# Patient Record
Sex: Female | Born: 1975 | Race: Black or African American | Hispanic: No | Marital: Single | Smoking: Never smoker
Health system: Southern US, Community
[De-identification: ages and names within clinical notes are randomized; demographics above are authoritative.]

## PROBLEM LIST (undated history)

## (undated) HISTORY — PX: OTHER SURGICAL HISTORY: SHX169

---

## 1999-04-02 ENCOUNTER — Emergency Department (HOSPITAL_COMMUNITY): Admission: EM | Admit: 1999-04-02 | Discharge: 1999-04-02 | Payer: Self-pay | Admitting: Emergency Medicine

## 2002-09-09 ENCOUNTER — Other Ambulatory Visit: Admission: RE | Admit: 2002-09-09 | Discharge: 2002-09-09 | Payer: Self-pay | Admitting: Obstetrics and Gynecology

## 2004-03-25 ENCOUNTER — Other Ambulatory Visit: Admission: RE | Admit: 2004-03-25 | Discharge: 2004-03-25 | Payer: Self-pay | Admitting: Obstetrics and Gynecology

## 2007-04-10 ENCOUNTER — Emergency Department (HOSPITAL_COMMUNITY): Admission: EM | Admit: 2007-04-10 | Discharge: 2007-04-10 | Payer: Self-pay | Admitting: Emergency Medicine

## 2008-05-14 ENCOUNTER — Emergency Department (HOSPITAL_COMMUNITY): Admission: EM | Admit: 2008-05-14 | Discharge: 2008-05-14 | Payer: Self-pay | Admitting: Emergency Medicine

## 2008-12-05 ENCOUNTER — Emergency Department (HOSPITAL_COMMUNITY): Admission: EM | Admit: 2008-12-05 | Discharge: 2008-12-05 | Payer: Self-pay | Admitting: Emergency Medicine

## 2008-12-08 ENCOUNTER — Emergency Department (HOSPITAL_COMMUNITY): Admission: EM | Admit: 2008-12-08 | Discharge: 2008-12-08 | Payer: Self-pay | Admitting: Emergency Medicine

## 2009-08-07 ENCOUNTER — Emergency Department (HOSPITAL_COMMUNITY): Admission: EM | Admit: 2009-08-07 | Discharge: 2009-08-07 | Payer: Self-pay | Admitting: Emergency Medicine

## 2010-05-28 ENCOUNTER — Emergency Department (HOSPITAL_COMMUNITY)
Admission: EM | Admit: 2010-05-28 | Discharge: 2010-05-28 | Disposition: A | Discharge status: Home / Self Care | Payer: Self-pay | Attending: Emergency Medicine | Admitting: Emergency Medicine

## 2010-05-28 DIAGNOSIS — M25569 Pain in unspecified knee: Secondary | ICD-10-CM | POA: Insufficient documentation

## 2010-05-28 DIAGNOSIS — M545 Low back pain, unspecified: Secondary | ICD-10-CM | POA: Insufficient documentation

## 2010-05-28 DIAGNOSIS — S8000XA Contusion of unspecified knee, initial encounter: Secondary | ICD-10-CM | POA: Insufficient documentation

## 2010-05-28 DIAGNOSIS — B002 Herpesviral gingivostomatitis and pharyngotonsillitis: Secondary | ICD-10-CM | POA: Insufficient documentation

## 2010-07-09 LAB — URINALYSIS, ROUTINE W REFLEX MICROSCOPIC
Bilirubin Urine: NEGATIVE
Glucose, UA: NEGATIVE mg/dL
Nitrite: NEGATIVE
Specific Gravity, Urine: 1.017 (ref 1.005–1.030)
Urobilinogen, UA: 0.2 mg/dL (ref 0.0–1.0)
pH: 6 (ref 5.0–8.0)

## 2010-07-09 LAB — WET PREP, GENITAL: Trich, Wet Prep: NONE SEEN

## 2010-07-09 LAB — URINE MICROSCOPIC-ADD ON

## 2010-07-09 LAB — GC/CHLAMYDIA PROBE AMP, GENITAL
Chlamydia, DNA Probe: NEGATIVE
GC Probe Amp, Genital: NEGATIVE

## 2010-07-27 LAB — POCT CARDIAC MARKERS
Myoglobin, poc: 57.6 ng/mL (ref 12–200)
Troponin i, poc: <0.05 ng/mL (ref 0.00–0.09)

## 2010-07-27 LAB — DIFFERENTIAL
Basophils Absolute: 0 10*3/uL (ref 0.0–0.1)
Basophils Relative: 0 % (ref 0–1)
Lymphs Abs: 1.5 10*3/uL (ref 0.7–4.0)
Monocytes Relative: 9 % (ref 3–12)

## 2010-07-27 LAB — URINALYSIS, ROUTINE W REFLEX MICROSCOPIC
Glucose, UA: NEGATIVE mg/dL
Ketones, ur: NEGATIVE mg/dL
Nitrite: NEGATIVE
Urobilinogen, UA: 0.2 mg/dL (ref 0.0–1.0)
pH: 5.5 (ref 5.0–8.0)

## 2010-07-27 LAB — URINE MICROSCOPIC-ADD ON

## 2010-07-27 LAB — BASIC METABOLIC PANEL
Calcium: 9.2 mg/dL (ref 8.4–10.5)
GFR calc Af Amer: >60 mL/min (ref >60)
Potassium: 3.4 mEq/L — ABNORMAL LOW (ref 3.5–5.1)
Sodium: 138 mEq/L (ref 135–145)

## 2010-07-27 LAB — CBC
HCT: 39.1 % (ref 36.0–46.0)
Hemoglobin: 13.6 g/dL (ref 12.0–15.0)
MCV: 101.4 fL — ABNORMAL HIGH (ref 78.0–100.0)
RDW: 12.8 % (ref 11.5–15.5)

## 2010-08-05 LAB — BASIC METABOLIC PANEL
Calcium: 8.8 mg/dL (ref 8.4–10.5)
Creatinine, Ser: 0.49 mg/dL (ref 0.4–1.2)
GFR calc non Af Amer: >60 mL/min (ref >60)
Glucose, Bld: 88 mg/dL (ref 70–99)

## 2010-08-05 LAB — URINALYSIS, ROUTINE W REFLEX MICROSCOPIC
Ketones, ur: 15 mg/dL — AB
Protein, ur: NEGATIVE mg/dL
Urobilinogen, UA: 1 mg/dL (ref 0.0–1.0)
pH: 7 (ref 5.0–8.0)

## 2010-08-05 LAB — WET PREP, GENITAL: Trich, Wet Prep: NONE SEEN

## 2010-08-05 LAB — DIFFERENTIAL
Eosinophils Absolute: 0.2 10*3/uL (ref 0.0–0.7)
Neutro Abs: 12.9 10*3/uL — ABNORMAL HIGH (ref 1.7–7.7)
Neutrophils Relative %: 77 % (ref 43–77)

## 2010-08-05 LAB — CBC
HCT: 28.5 % — ABNORMAL LOW (ref 36.0–46.0)
Platelets: 219 10*3/uL (ref 150–400)
RBC: 2.92 MIL/uL — ABNORMAL LOW (ref 3.87–5.11)

## 2010-08-24 ENCOUNTER — Emergency Department (HOSPITAL_COMMUNITY)
Admission: EM | Admit: 2010-08-24 | Discharge: 2010-08-25 | Disposition: A | Discharge status: Home / Self Care | Payer: Self-pay | Attending: Emergency Medicine | Admitting: Emergency Medicine

## 2010-08-24 DIAGNOSIS — R112 Nausea with vomiting, unspecified: Secondary | ICD-10-CM | POA: Insufficient documentation

## 2010-08-24 DIAGNOSIS — F329 Major depressive disorder, single episode, unspecified: Secondary | ICD-10-CM | POA: Insufficient documentation

## 2010-08-24 DIAGNOSIS — R5383 Other fatigue: Secondary | ICD-10-CM | POA: Insufficient documentation

## 2010-08-24 DIAGNOSIS — F3289 Other specified depressive episodes: Secondary | ICD-10-CM | POA: Insufficient documentation

## 2010-08-24 DIAGNOSIS — F1410 Cocaine abuse, uncomplicated: Secondary | ICD-10-CM | POA: Insufficient documentation

## 2010-08-24 DIAGNOSIS — F1210 Cannabis abuse, uncomplicated: Secondary | ICD-10-CM | POA: Insufficient documentation

## 2010-08-24 DIAGNOSIS — R61 Generalized hyperhidrosis: Secondary | ICD-10-CM | POA: Insufficient documentation

## 2010-08-24 DIAGNOSIS — R5381 Other malaise: Secondary | ICD-10-CM | POA: Insufficient documentation

## 2010-08-24 LAB — POCT PREGNANCY, URINE: Preg Test, Ur: NEGATIVE

## 2010-08-24 LAB — DIFFERENTIAL
Basophils Relative: 0 % (ref 0–1)
Eosinophils Absolute: 0.1 10*3/uL (ref 0.0–0.7)
Lymphocytes Relative: 30 % (ref 12–46)
Lymphs Abs: 2.4 10*3/uL (ref 0.7–4.0)
Monocytes Relative: 9 % (ref 3–12)

## 2010-08-24 LAB — COMPREHENSIVE METABOLIC PANEL
AST: 14 U/L (ref 0–37)
Albumin: 3.4 g/dL — ABNORMAL LOW (ref 3.5–5.2)
Calcium: 8.9 mg/dL (ref 8.4–10.5)
Creatinine, Ser: 0.87 mg/dL (ref 0.4–1.2)
GFR calc Af Amer: >60 mL/min (ref >60)
Total Protein: 6.6 g/dL (ref 6.0–8.3)

## 2010-08-24 LAB — CBC
HCT: 38.1 % (ref 36.0–46.0)
Hemoglobin: 13.5 g/dL (ref 12.0–15.0)
MCHC: 35.4 g/dL (ref 30.0–36.0)
MCV: 95.3 fL (ref 78.0–100.0)
RBC: 4 MIL/uL (ref 3.87–5.11)
RDW: 13.1 % (ref 11.5–15.5)
WBC: 8.1 10*3/uL (ref 4.0–10.5)

## 2010-08-25 LAB — RAPID URINE DRUG SCREEN, HOSP PERFORMED
Amphetamines: NOT DETECTED
Barbiturates: NOT DETECTED
Benzodiazepines: NOT DETECTED
Opiates: NOT DETECTED

## 2011-01-24 LAB — DIFFERENTIAL
Basophils Absolute: 0
Basophils Relative: 0
Eosinophils Absolute: 0.1 — ABNORMAL LOW
Eosinophils Relative: 1
Lymphocytes Relative: 17
Monocytes Absolute: 0.8

## 2011-01-24 LAB — CBC
HCT: 41.7
Hemoglobin: 14.8
MCHC: 35.4
MCV: 98.2
Platelets: 232
RDW: 13

## 2011-01-24 LAB — BASIC METABOLIC PANEL
BUN: 4 — ABNORMAL LOW
CO2: 25
GFR calc non Af Amer: >60
Glucose, Bld: 90
Potassium: 3.9

## 2011-01-24 LAB — RAPID URINE DRUG SCREEN, HOSP PERFORMED
Amphetamines: NOT DETECTED
Cocaine: POSITIVE — AB
Tetrahydrocannabinol: POSITIVE — AB

## 2011-01-24 LAB — URINALYSIS, ROUTINE W REFLEX MICROSCOPIC
Bilirubin Urine: NEGATIVE
Nitrite: NEGATIVE
Protein, ur: NEGATIVE
Specific Gravity, Urine: 1.004 — ABNORMAL LOW
Urobilinogen, UA: 1

## 2011-01-24 LAB — PREGNANCY, URINE: Preg Test, Ur: NEGATIVE

## 2011-01-24 LAB — URINE MICROSCOPIC-ADD ON

## 2011-12-06 IMAGING — CR DG SACRUM/COCCYX 2+V
3 series · 3 of 3 positions shown · non-contrast
Comparison: None

CLINICAL DATA: Generalized abdominal pain; fell 08/06/2009 onto
coccyx.

SACRUM AND COCCYX - 2+ VIEW

[t sacrum a.p.]
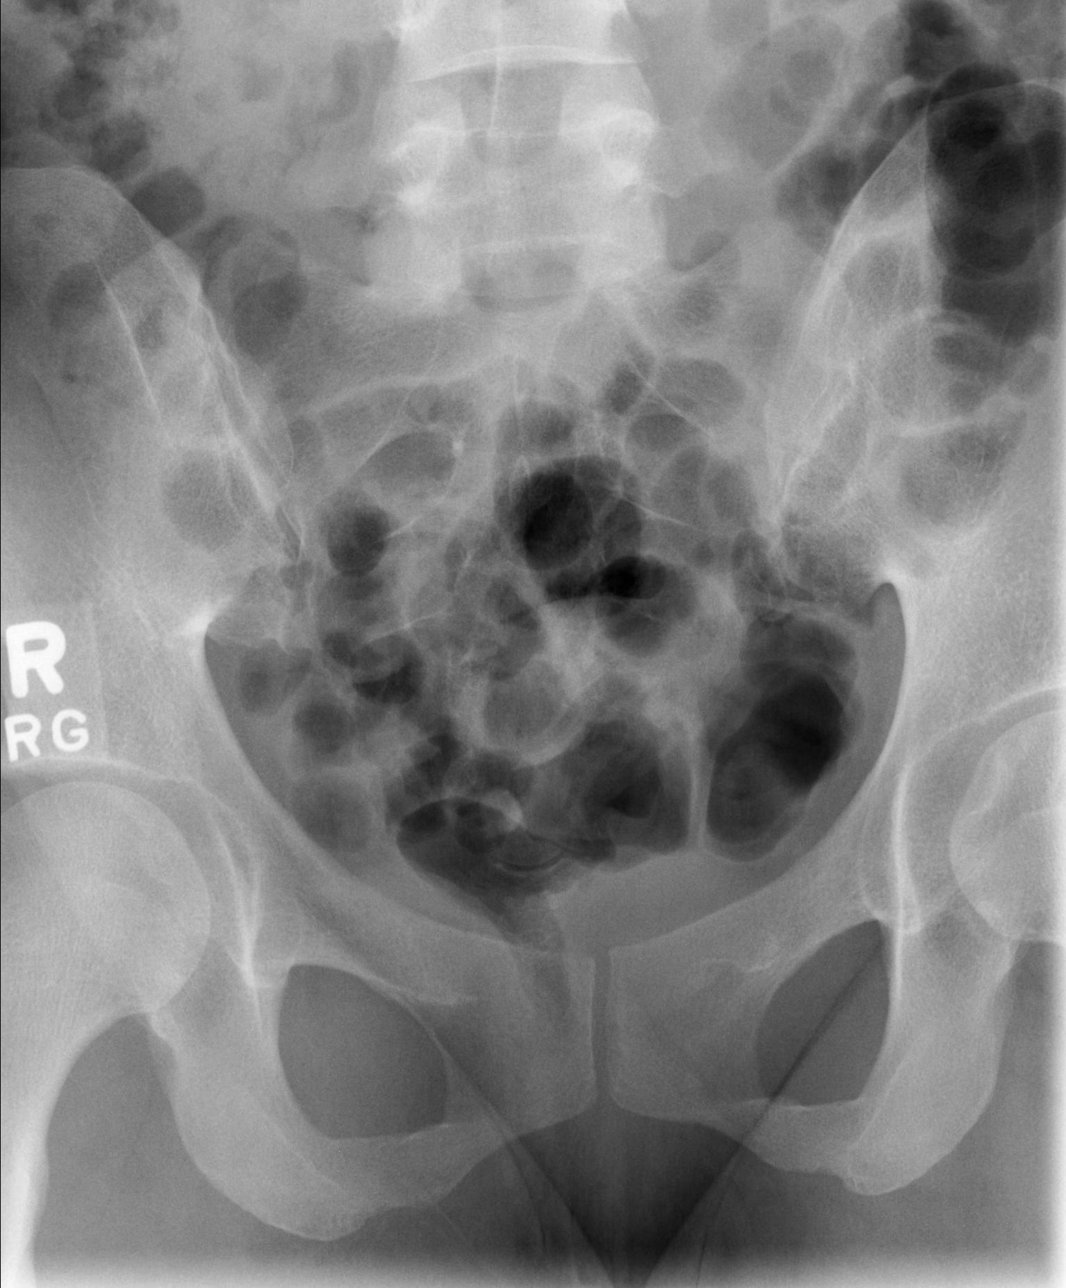

[t coccyx a.p.]
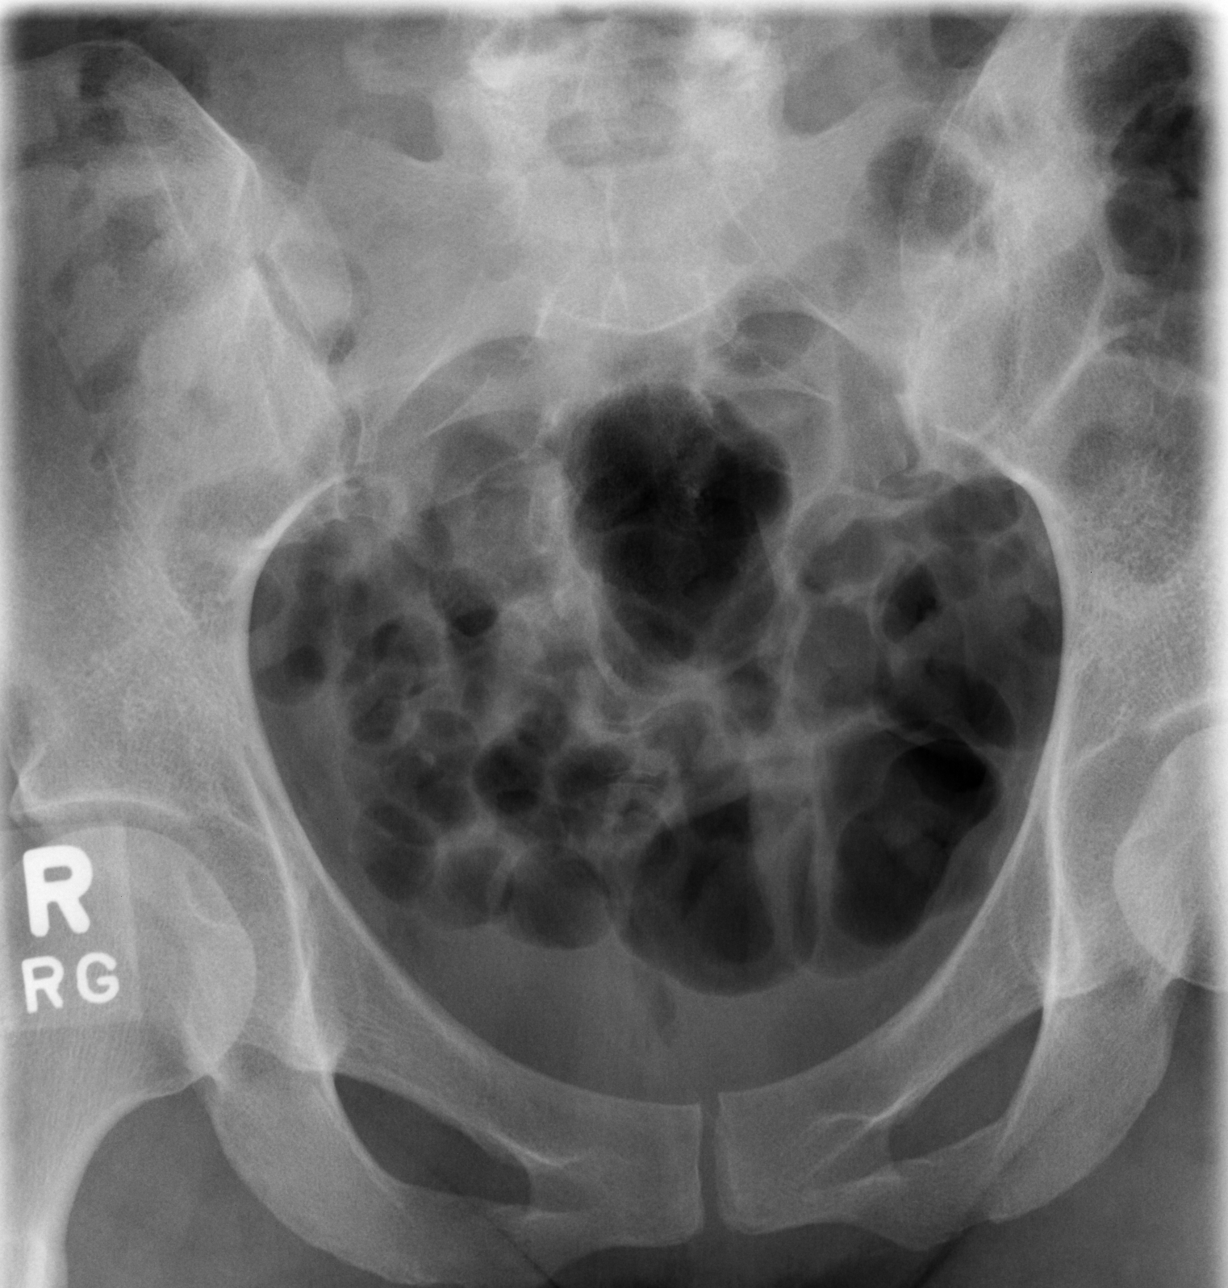

[t sacrum lat]
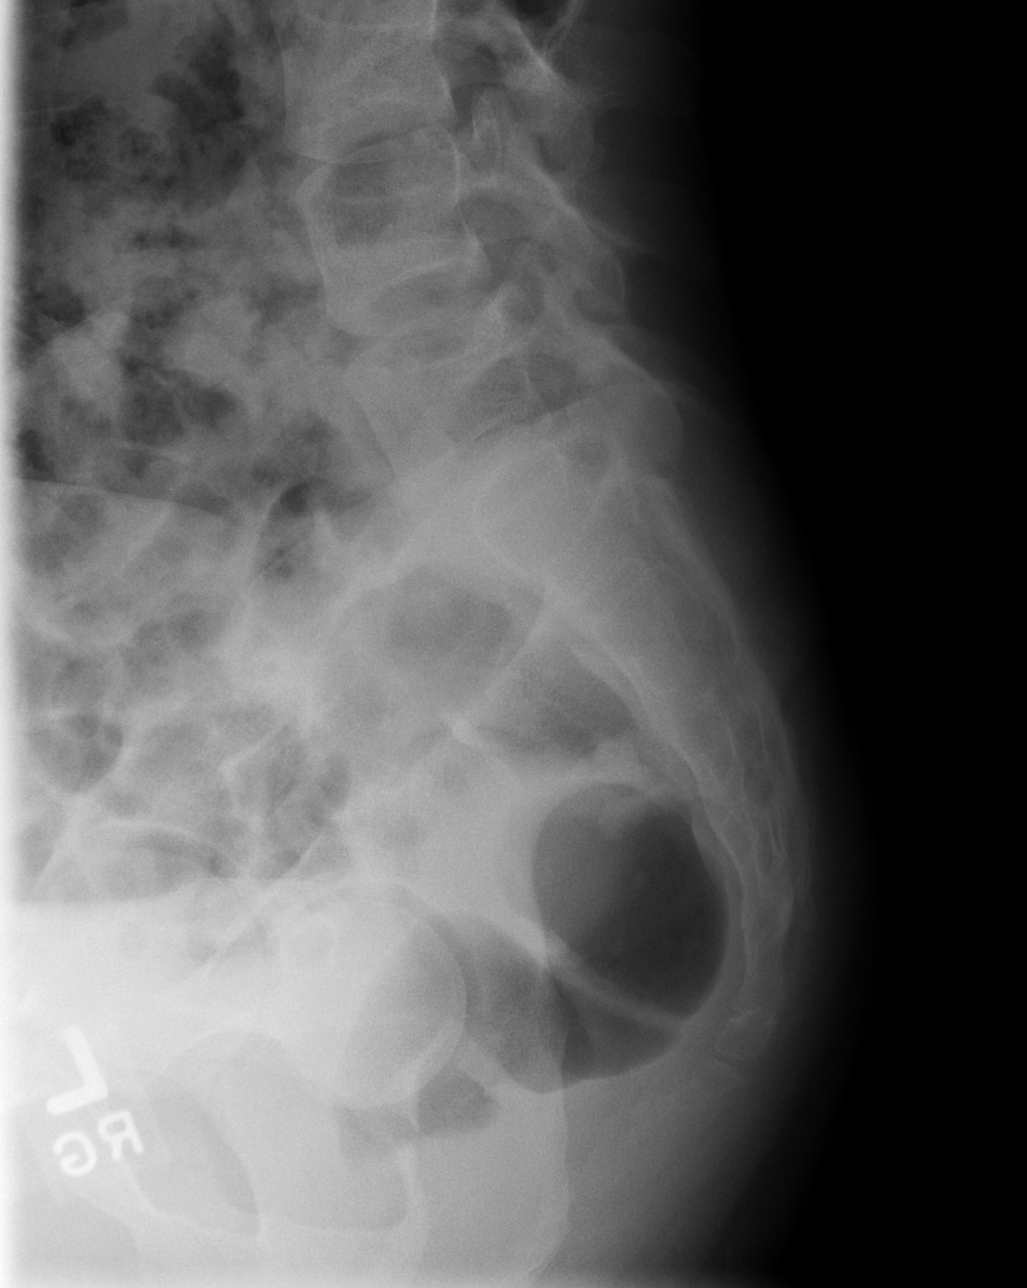

[3 of 3 positions shown; findings below may reference images not displayed]

FINDINGS: The sacrum and coccyx appear grossly intact.  There is no
evidence of coccygeal dislocation.  The distal lumbar spine is
grossly unremarkable in appearance.  The sacroiliac joints are
within normal limits.  The visualized bowel gas pattern is
unremarkable.
IMPRESSION: Sacrum and coccyx appear grossly intact.

## 2016-02-15 ENCOUNTER — Ambulatory Visit (INDEPENDENT_AMBULATORY_CARE_PROVIDER_SITE_OTHER): Payer: BLUE CROSS/BLUE SHIELD

## 2016-02-15 ENCOUNTER — Ambulatory Visit
Admission: EM | Admit: 2016-02-15 | Discharge: 2016-02-15 | Disposition: A | Discharge status: Home / Self Care | Payer: BLUE CROSS/BLUE SHIELD | Attending: Family Medicine | Admitting: Family Medicine

## 2016-02-15 ENCOUNTER — Encounter: Payer: Self-pay | Admitting: Emergency Medicine

## 2016-02-15 DIAGNOSIS — L02512 Cutaneous abscess of left hand: Secondary | ICD-10-CM

## 2016-02-15 MED ORDER — MUPIROCIN 2 % EX OINT: 1.0000 "application " | TOPICAL_OINTMENT | Freq: Three times a day (TID) | CUTANEOUS | 0 refills | Status: AC

## 2016-02-15 MED ORDER — SULFAMETHOXAZOLE-TRIMETHOPRIM 800-160 MG PO TABS: 1.0000 | ORAL_TABLET | Freq: Two times a day (BID) | ORAL | 0 refills | Status: AC

## 2016-02-15 NOTE — ED Provider Notes (Signed)
CSN: [id number]     Arrival date & time 02/15/16  1234 History   First MD Initiated Contact with Patient 02/15/16 1343     Chief Complaint  Patient presents with  . Finger Injury    left thumb   (Consider location/radiation/quality/duration/timing/severity/associated sxs/prior Treatment) HPI  This is a 40 year old female who presents with left nondominant thumb tip pain. She states that 2 weeks ago while at work she jammed her thumb into a Wood staple was exposed in a drawer. She states that over time it has become more painful it is hard at the tip and throbs. Put off being seen because she is working 2 jobs and just never seemed to have the time. She has had no drainage from the area.       History reviewed. No pertinent past medical history. Past Surgical History:  Procedure Laterality Date  . ORIF right ankle     Family History  Problem Relation Age of Onset  . Healthy Mother   . Diabetes Father    Social History  Substance Use Topics  . Smoking status: Never Smoker  . Smokeless tobacco: Never Used  . Alcohol use No   OB History    No data available     Review of Systems  Constitutional: Negative for activity change, chills, fatigue and fever.  Skin: Positive for color change and wound.  All other systems reviewed and are negative.   Allergies  Review of patient's allergies indicates no known allergies.  Home Medications   Prior to Admission medications   Medication Sig Start Date End Date Taking? Authorizing Provider  escitalopram (LEXAPRO) 20 MG tablet Take 20 mg by mouth daily.   Yes Historical Provider, MD  hydrochlorothiazide (MICROZIDE) 12.5 MG capsule Take 12.5 mg by mouth daily.   Yes Historical Provider, MD  risperiDONE microspheres (RISPERDAL CONSTA) 25 MG injection Inject 25 mg into the muscle every 14 (fourteen) days.   Yes Historical Provider, MD  mupirocin ointment (BACTROBAN) 2 % Apply 1 application topically 3 (three) times daily. 02/15/16    Lutricia Feil, PA-C  sulfamethoxazole-trimethoprim (BACTRIM DS,SEPTRA DS) 800-160 MG tablet Take 1 tablet by mouth 2 (two) times daily. 02/15/16   Lutricia Feil, PA-C   Meds Ordered and Administered this Visit  Medications - No data to display  BP 138/88 (BP Location: Left Arm)   Pulse 100   Temp 98.5 F (36.9 C) (Oral)   Resp 16   Ht 5\' 6"  (1.676 m)   Wt 187 lb (84.8 kg)   LMP 02/07/2016 (Exact Date)   SpO2 100%   BMI 30.18 kg/m  No data found.   Physical Exam  Constitutional: She is oriented to person, place, and time. She appears well-developed and well-nourished. No distress.  HENT:  Head: Normocephalic and atraumatic.  Eyes: EOM are normal. Pupils are equal, round, and reactive to light.  Neck: Normal range of motion. Neck supple.  Musculoskeletal: Normal range of motion. She exhibits edema and tenderness.  Neurological: She is alert and oriented to person, place, and time.  Skin: Skin is warm and dry. She is not diaphoretic.  Examination of the left nondominant thumb shows induration and some fluctuance over the very tip just in the tuft below the nail not involving the nail bed. Tuft itself is actually very soft without evidence of a felon. She has a small laceration over the tip that has closed but is still present. Is no drainage at present. He has a hip  esthesia over the indurated area.  Psychiatric: She has a normal mood and affect. Her behavior is normal. Judgment and thought content normal.  Nursing note and vitals reviewed.   Urgent Care Course   Clinical Course    Procedures (including critical care time)  Labs Review Labs Reviewed - No data to display  Imaging Review Dg Finger Thumb Left  Result Date: 02/15/2016 CLINICAL DATA:  Left thumb pain after injury with wood staple 8 days ago at work. EXAM: LEFT THUMB 2+V COMPARISON:  None. FINDINGS: There is no evidence of fracture or dislocation. There is no evidence of arthropathy or other focal bone  abnormality. Soft tissues are unremarkable IMPRESSION: Negative. Electronically Signed   By: Elberta Fortisaniel  Boyle M.D.   On: 02/15/2016 14:25     Visual Acuity Review  Right Eye Distance:   Left Eye Distance:   Bilateral Distance:    Right Eye Near:   Left Eye Near:    Bilateral Near:     Procedure: After explaining the procedure to the patient and she accepted, the tip of the thumb was prepped widely and multiple times with a Hibiclens swab. An 18-gauge needle was then utilized to enter through the previous laceration and carried into the deeper tissues. The needle was then withdrawn and purulent material was drained. The wound was massaged expressing more purulence until no further purulence was able to be expressed. A dry sterile dressing was then applied to the area and this was protected with a Stack plastic splint. I reviewed with the patient the instructions for warm compresses Bactroban use and use of Septra. She will return on Sunday to follow-up with Dr. Thurmond ButtsWade. The patient and Dr. Thurmond ButtsWade were introduced to one another.    MDM   1. Abscess of left thumb    Discharge Medication List as of 02/15/2016  3:15 PM    START taking these medications   Details  mupirocin ointment (BACTROBAN) 2 % Apply 1 application topically 3 (three) times daily., Starting Fri 02/15/2016, Normal    sulfamethoxazole-trimethoprim (BACTRIM DS,SEPTRA DS) 800-160 MG tablet Take 1 tablet by mouth 2 (two) times daily., Starting Fri 02/15/2016, Normal      Plan: 1. Test/x-ray results and diagnosis reviewed with patient 2. rx as per orders; risks, benefits, potential side effects reviewed with patient 3. Recommend supportive treatment with medication and protection. See above. She will return in 2 days for a wound recheck with Dr. Thurmond ButtsWade. 4. F/u prn if symptoms worsen or don't improve     Lutricia FeilWilliam P Shylin Keizer, PA-C 02/15/16 1530

## 2016-02-15 NOTE — ED Triage Notes (Signed)
Patient c/o pain in her left thumb after she accidentally stapled her left thumb at work 2 weeks ago.

## 2016-02-15 NOTE — Discharge Instructions (Signed)
Apply warm compresses to left thumb 3 times daily for 10 minutes each time. Dry thoroughly and apply Bactroban ointment to hard area of the thumb 3 times daily. Use splint on thumb tip to prevent further injury. Follow up for wound recheck on Sunday, 02/17/2016 with Dr. Thurmond ButtsWade.

## 2016-02-19 ENCOUNTER — Telehealth: Payer: Self-pay

## 2016-02-19 NOTE — Telephone Encounter (Signed)
Courtesy call back completed today [facility]. Patient improved and will follow up with their PCP if symptoms continue or worsen.  She has an appt with her PCP tomorrow.

## 2018-06-15 IMAGING — CR DG FINGER THUMB 2+V*L*
3 series · 3 of 3 positions shown · non-contrast
Comparison: None.

CLINICAL DATA: Left thumb pain after injury with Insub Sperling 8 days
ago at work.

EXAM:
LEFT THUMB 2+V

[finger ap]
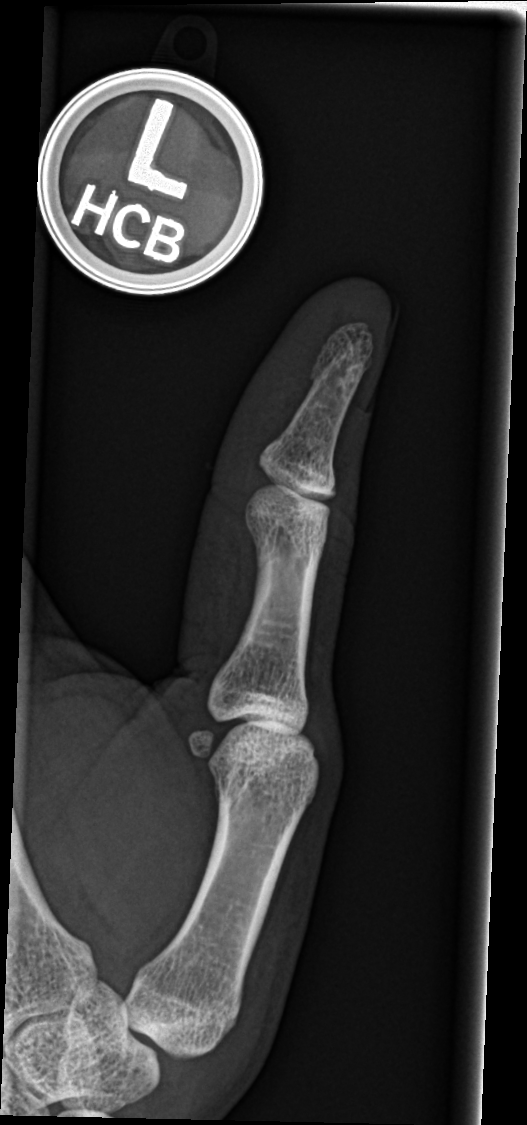

[finger obl]
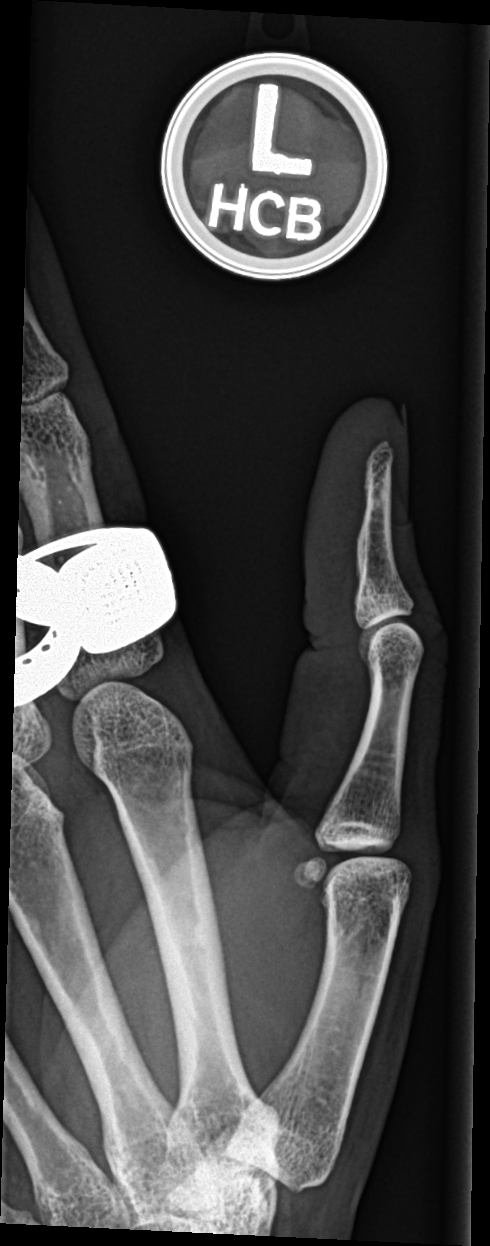

[finger lat]
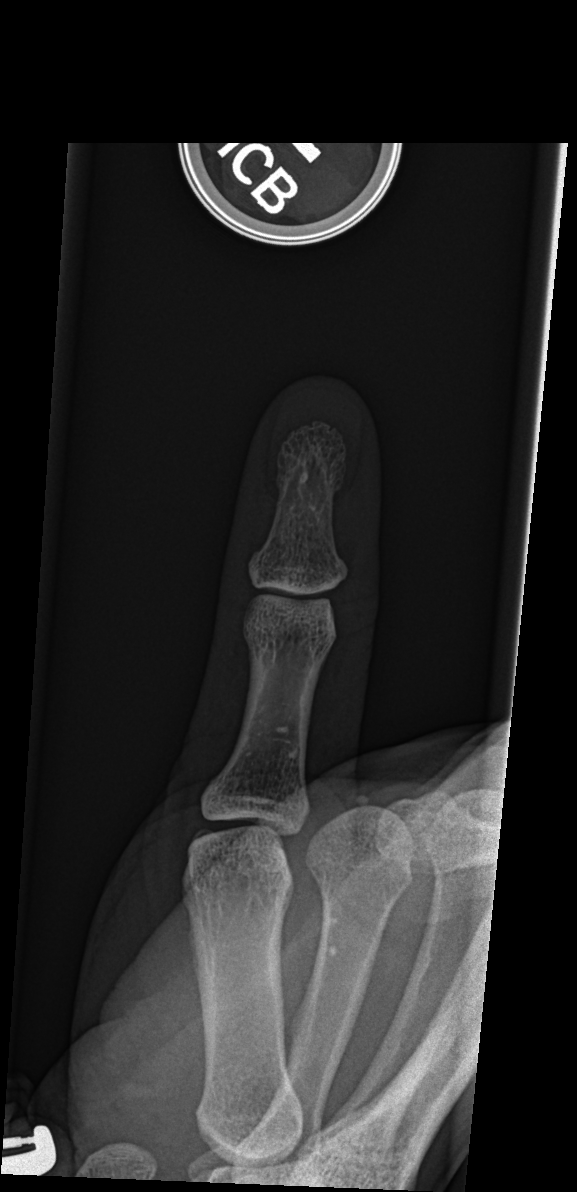

[3 of 3 positions shown; findings below may reference images not displayed]

FINDINGS: There is no evidence of fracture or dislocation. There is no
evidence of arthropathy or other focal bone abnormality. Soft
tissues are unremarkable
IMPRESSION: Negative.

## 2019-02-02 ENCOUNTER — Ambulatory Visit
Admission: EM | Admit: 2019-02-02 | Discharge: 2019-02-02 | Discharge status: Against Medical Advice | Payer: Worker's Compensation

## 2019-02-02 ENCOUNTER — Other Ambulatory Visit: Payer: Self-pay
